# Patient Record
Sex: Male | Born: 1935 | Race: White | Hispanic: No | Marital: Married | Smoking: Former smoker
Health system: Southern US, Community
[De-identification: ages and names within clinical notes are randomized; demographics above are authoritative.]

## PROBLEM LIST (undated history)

## (undated) DIAGNOSIS — J449 Chronic obstructive pulmonary disease, unspecified: Secondary | ICD-10-CM

## (undated) DIAGNOSIS — I5020 Unspecified systolic (congestive) heart failure: Secondary | ICD-10-CM

## (undated) DIAGNOSIS — I4891 Unspecified atrial fibrillation: Secondary | ICD-10-CM

## (undated) DIAGNOSIS — N400 Enlarged prostate without lower urinary tract symptoms: Secondary | ICD-10-CM

---

## 2017-06-07 ENCOUNTER — Other Ambulatory Visit: Payer: Self-pay

## 2017-06-07 ENCOUNTER — Emergency Department: Payer: Medicare Other

## 2017-06-07 ENCOUNTER — Inpatient Hospital Stay
Admission: EM | Admit: 2017-06-07 | Discharge: 2017-06-11 | DRG: 682 | Disposition: A | Discharge status: Home Health | Payer: Medicare Other | Attending: Internal Medicine | Admitting: Internal Medicine

## 2017-06-07 DIAGNOSIS — I959 Hypotension, unspecified: Secondary | ICD-10-CM | POA: Diagnosis present

## 2017-06-07 DIAGNOSIS — L8990 Pressure ulcer of unspecified site, unspecified stage: Secondary | ICD-10-CM

## 2017-06-07 DIAGNOSIS — Z7951 Long term (current) use of inhaled steroids: Secondary | ICD-10-CM

## 2017-06-07 DIAGNOSIS — Z7189 Other specified counseling: Secondary | ICD-10-CM

## 2017-06-07 DIAGNOSIS — J441 Chronic obstructive pulmonary disease with (acute) exacerbation: Secondary | ICD-10-CM | POA: Diagnosis not present

## 2017-06-07 DIAGNOSIS — R531 Weakness: Secondary | ICD-10-CM

## 2017-06-07 DIAGNOSIS — R0902 Hypoxemia: Secondary | ICD-10-CM | POA: Diagnosis present

## 2017-06-07 DIAGNOSIS — N400 Enlarged prostate without lower urinary tract symptoms: Secondary | ICD-10-CM | POA: Diagnosis present

## 2017-06-07 DIAGNOSIS — Z87891 Personal history of nicotine dependence: Secondary | ICD-10-CM | POA: Diagnosis not present

## 2017-06-07 DIAGNOSIS — Z7901 Long term (current) use of anticoagulants: Secondary | ICD-10-CM

## 2017-06-07 DIAGNOSIS — Z66 Do not resuscitate: Secondary | ICD-10-CM | POA: Diagnosis not present

## 2017-06-07 DIAGNOSIS — G9341 Metabolic encephalopathy: Secondary | ICD-10-CM | POA: Diagnosis not present

## 2017-06-07 DIAGNOSIS — M109 Gout, unspecified: Secondary | ICD-10-CM | POA: Diagnosis not present

## 2017-06-07 DIAGNOSIS — Z79899 Other long term (current) drug therapy: Secondary | ICD-10-CM

## 2017-06-07 DIAGNOSIS — Z7982 Long term (current) use of aspirin: Secondary | ICD-10-CM | POA: Diagnosis not present

## 2017-06-07 DIAGNOSIS — R4182 Altered mental status, unspecified: Secondary | ICD-10-CM

## 2017-06-07 DIAGNOSIS — I5042 Chronic combined systolic (congestive) and diastolic (congestive) heart failure: Secondary | ICD-10-CM | POA: Diagnosis present

## 2017-06-07 DIAGNOSIS — I4891 Unspecified atrial fibrillation: Secondary | ICD-10-CM | POA: Diagnosis present

## 2017-06-07 DIAGNOSIS — N179 Acute kidney failure, unspecified: Secondary | ICD-10-CM | POA: Diagnosis not present

## 2017-06-07 DIAGNOSIS — Z515 Encounter for palliative care: Secondary | ICD-10-CM

## 2017-06-07 DIAGNOSIS — E860 Dehydration: Secondary | ICD-10-CM | POA: Diagnosis not present

## 2017-06-07 DIAGNOSIS — E785 Hyperlipidemia, unspecified: Secondary | ICD-10-CM | POA: Diagnosis present

## 2017-06-07 HISTORY — DX: Chronic obstructive pulmonary disease, unspecified: J44.9

## 2017-06-07 HISTORY — DX: Unspecified atrial fibrillation: I48.91

## 2017-06-07 HISTORY — DX: Unspecified systolic (congestive) heart failure: I50.20

## 2017-06-07 HISTORY — DX: Benign prostatic hyperplasia without lower urinary tract symptoms: N40.0

## 2017-06-07 LAB — CBC
HCT: 41.4 % (ref 40.0–52.0)
Hemoglobin: 13.3 g/dL (ref 13.0–18.0)
MCH: 28.5 pg (ref 26.0–34.0)
MCHC: 32.2 g/dL (ref 32.0–36.0)
MCV: 88.7 fL (ref 80.0–100.0)
PLATELETS: 252 10*3/uL (ref 150–440)
RBC: 4.67 MIL/uL (ref 4.40–5.90)
RDW: 14.8 % — AB (ref 11.5–14.5)
WBC: 12.5 10*3/uL — ABNORMAL HIGH (ref 3.8–10.6)

## 2017-06-07 LAB — COMPREHENSIVE METABOLIC PANEL
ALK PHOS: 55 U/L (ref 38–126)
ALT: 17 U/L (ref 17–63)
AST: 28 U/L (ref 15–41)
Albumin: 3.5 g/dL (ref 3.5–5.0)
Anion gap: 10 (ref 5–15)
BUN: 21 mg/dL — AB (ref 6–20)
CALCIUM: 9 mg/dL (ref 8.9–10.3)
CO2: 28 mmol/L (ref 22–32)
CREATININE: 1.34 mg/dL — AB (ref 0.61–1.24)
Chloride: 102 mmol/L (ref 101–111)
GFR calc Af Amer: 56 mL/min — ABNORMAL LOW (ref >60)
GFR, EST NON AFRICAN AMERICAN: 48 mL/min — AB (ref >60)
Glucose, Bld: 136 mg/dL — ABNORMAL HIGH (ref 65–99)
Potassium: 4.2 mmol/L (ref 3.5–5.1)
Sodium: 140 mmol/L (ref 135–145)
Total Bilirubin: 1 mg/dL (ref 0.3–1.2)
Total Protein: 7.3 g/dL (ref 6.5–8.1)

## 2017-06-07 LAB — ACETAMINOPHEN LEVEL: Acetaminophen (Tylenol), Serum: <10 ug/mL — ABNORMAL LOW (ref 10–30)

## 2017-06-07 LAB — ETHANOL: Alcohol, Ethyl (B): <10 mg/dL (ref <10)

## 2017-06-07 LAB — TROPONIN I: TROPONIN I: 0.03 ng/mL — AB (ref <0.03)

## 2017-06-07 LAB — SALICYLATE LEVEL: Salicylate Lvl: <7 mg/dL (ref 2.8–30.0)
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
## 2017-06-07 NOTE — ED Notes (Signed)
Spoke with wife - she states he left the house without his oxygen tank. Is always on 4 liters and his o2 sats drop just walking a few feet without it. Pt without oxygen for at least an hour.

## 2017-06-08 ENCOUNTER — Other Ambulatory Visit: Payer: Self-pay

## 2017-06-08 DIAGNOSIS — I5042 Chronic combined systolic (congestive) and diastolic (congestive) heart failure: Secondary | ICD-10-CM | POA: Diagnosis not present

## 2017-06-08 DIAGNOSIS — Z79899 Other long term (current) drug therapy: Secondary | ICD-10-CM | POA: Diagnosis not present

## 2017-06-08 DIAGNOSIS — Z66 Do not resuscitate: Secondary | ICD-10-CM | POA: Diagnosis not present

## 2017-06-08 DIAGNOSIS — N179 Acute kidney failure, unspecified: Secondary | ICD-10-CM | POA: Diagnosis present

## 2017-06-08 DIAGNOSIS — Z7901 Long term (current) use of anticoagulants: Secondary | ICD-10-CM | POA: Diagnosis not present

## 2017-06-08 DIAGNOSIS — E860 Dehydration: Secondary | ICD-10-CM | POA: Diagnosis not present

## 2017-06-08 DIAGNOSIS — Z87891 Personal history of nicotine dependence: Secondary | ICD-10-CM | POA: Diagnosis not present

## 2017-06-08 DIAGNOSIS — I959 Hypotension, unspecified: Secondary | ICD-10-CM | POA: Diagnosis not present

## 2017-06-08 DIAGNOSIS — E785 Hyperlipidemia, unspecified: Secondary | ICD-10-CM | POA: Diagnosis not present

## 2017-06-08 DIAGNOSIS — M109 Gout, unspecified: Secondary | ICD-10-CM | POA: Diagnosis not present

## 2017-06-08 DIAGNOSIS — I4891 Unspecified atrial fibrillation: Secondary | ICD-10-CM | POA: Diagnosis not present

## 2017-06-08 DIAGNOSIS — G9341 Metabolic encephalopathy: Secondary | ICD-10-CM | POA: Diagnosis not present

## 2017-06-08 DIAGNOSIS — J441 Chronic obstructive pulmonary disease with (acute) exacerbation: Secondary | ICD-10-CM | POA: Diagnosis not present

## 2017-06-08 DIAGNOSIS — N400 Enlarged prostate without lower urinary tract symptoms: Secondary | ICD-10-CM | POA: Diagnosis not present

## 2017-06-08 DIAGNOSIS — R0902 Hypoxemia: Secondary | ICD-10-CM | POA: Diagnosis not present

## 2017-06-08 DIAGNOSIS — Z7951 Long term (current) use of inhaled steroids: Secondary | ICD-10-CM | POA: Diagnosis not present

## 2017-06-08 DIAGNOSIS — Z7982 Long term (current) use of aspirin: Secondary | ICD-10-CM | POA: Diagnosis not present

## 2017-06-08 DIAGNOSIS — L8990 Pressure ulcer of unspecified site, unspecified stage: Secondary | ICD-10-CM

## 2017-06-08 DIAGNOSIS — R4182 Altered mental status, unspecified: Secondary | ICD-10-CM | POA: Diagnosis not present

## 2017-06-08 LAB — URINALYSIS, COMPLETE (UACMP) WITH MICROSCOPIC
Bilirubin Urine: NEGATIVE
Glucose, UA: NEGATIVE mg/dL
Hgb urine dipstick: NEGATIVE
Ketones, ur: 5 mg/dL — AB
Leukocytes, UA: NEGATIVE
Nitrite: NEGATIVE
Protein, ur: 30 mg/dL — AB
Specific Gravity, Urine: 1.025 (ref 1.005–1.030)
pH: 5 (ref 5.0–8.0)

## 2017-06-08 LAB — URINE DRUG SCREEN, QUALITATIVE (ARMC ONLY)
Amphetamines, Ur Screen: NOT DETECTED
Barbiturates, Ur Screen: NOT DETECTED
Benzodiazepine, Ur Scrn: POSITIVE — AB
Cannabinoid 50 Ng, Ur ~~LOC~~: NOT DETECTED
Cocaine Metabolite,Ur ~~LOC~~: NOT DETECTED
MDMA (Ecstasy)Ur Screen: POSITIVE — AB
Methadone Scn, Ur: NOT DETECTED
Opiate, Ur Screen: NOT DETECTED
Phencyclidine (PCP) Ur S: NOT DETECTED
Tricyclic, Ur Screen: NOT DETECTED

## 2017-06-08 LAB — MAGNESIUM: Magnesium: 1.7 mg/dL (ref 1.7–2.4)

## 2017-06-08 LAB — BLOOD GAS, ARTERIAL
Acid-Base Excess: 1.1 mmol/L (ref 0.0–2.0)
Bicarbonate: 25.1 mmol/L (ref 20.0–28.0)
FIO2: 0.36
O2 Saturation: 93.5 %
Patient temperature: 37
pCO2 arterial: 37 mmHg (ref 32.0–48.0)
pH, Arterial: 7.44 (ref 7.350–7.450)
pO2, Arterial: 66 mmHg — ABNORMAL LOW (ref 83.0–108.0)

## 2017-06-08 LAB — TSH: TSH: 1.894 u[IU]/mL (ref 0.350–4.500)

## 2017-06-08 LAB — PROTIME-INR
INR: 2.06
Prothrombin Time: 23 seconds — ABNORMAL HIGH (ref 11.4–15.2)

## 2017-06-08 LAB — DIGOXIN LEVEL: Digoxin Level: 0.9 ng/mL (ref 0.8–2.0)

## 2017-06-08 MED ORDER — ALLOPURINOL 100 MG PO TABS
100.0000 mg | ORAL_TABLET | Freq: Every day | ORAL | Status: DC
  Administered 2017-06-08 – 2017-06-11 (×4): 100 mg via ORAL
  Filled 2017-06-08 (×4): qty 1

## 2017-06-08 MED ORDER — WARFARIN SODIUM 3 MG PO TABS
3.0000 mg | ORAL_TABLET | Freq: Every day | ORAL | Status: DC
  Administered 2017-06-08 – 2017-06-10 (×3): 3 mg via ORAL
  Filled 2017-06-08 (×4): qty 1

## 2017-06-08 MED ORDER — MOMETASONE FURO-FORMOTEROL FUM 200-5 MCG/ACT IN AERO
2.0000 | INHALATION_SPRAY | Freq: Two times a day (BID) | RESPIRATORY_TRACT | Status: DC
  Administered 2017-06-08 – 2017-06-11 (×7): 2 via RESPIRATORY_TRACT
  Filled 2017-06-08: qty 8.8

## 2017-06-08 MED ORDER — GUAIFENESIN 200 MG PO TABS
200.0000 mg | ORAL_TABLET | ORAL | Status: DC | PRN
  Filled 2017-06-08: qty 1

## 2017-06-08 MED ORDER — ONDANSETRON HCL 4 MG/2ML IJ SOLN: 4.0000 mg | Freq: Four times a day (QID) | INTRAMUSCULAR | Status: DC | PRN

## 2017-06-08 MED ORDER — CLONAZEPAM 0.5 MG PO TABS
0.5000 mg | ORAL_TABLET | Freq: Two times a day (BID) | ORAL | Status: DC | PRN
  Administered 2017-06-08 – 2017-06-11 (×6): 0.5 mg via ORAL
  Filled 2017-06-08 (×6): qty 1

## 2017-06-08 MED ORDER — TORSEMIDE 20 MG PO TABS
10.0000 mg | ORAL_TABLET | Freq: Every day | ORAL | Status: DC
  Administered 2017-06-08 – 2017-06-09 (×2): 10 mg via ORAL
  Filled 2017-06-08 (×2): qty 1

## 2017-06-08 MED ORDER — ONDANSETRON HCL 4 MG PO TABS: 4.0000 mg | ORAL_TABLET | Freq: Four times a day (QID) | ORAL | Status: DC | PRN

## 2017-06-08 MED ORDER — VITAMIN D 1000 UNITS PO TABS
2000.0000 [IU] | ORAL_TABLET | Freq: Every day | ORAL | Status: DC
  Administered 2017-06-08 – 2017-06-11 (×4): 2000 [IU] via ORAL
  Filled 2017-06-08 (×4): qty 2

## 2017-06-08 MED ORDER — ADULT MULTIVITAMIN W/MINERALS CH
1.0000 | ORAL_TABLET | Freq: Every day | ORAL | Status: DC
  Administered 2017-06-08 – 2017-06-11 (×4): 1 via ORAL
  Filled 2017-06-08 (×4): qty 1

## 2017-06-08 MED ORDER — ATORVASTATIN CALCIUM 20 MG PO TABS
40.0000 mg | ORAL_TABLET | Freq: Every day | ORAL | Status: DC
  Administered 2017-06-08 – 2017-06-11 (×4): 40 mg via ORAL
  Filled 2017-06-08 (×4): qty 2

## 2017-06-08 MED ORDER — FENOFIBRATE 160 MG PO TABS
160.0000 mg | ORAL_TABLET | Freq: Every day | ORAL | Status: DC
  Administered 2017-06-08 – 2017-06-11 (×4): 160 mg via ORAL
  Filled 2017-06-08 (×4): qty 1

## 2017-06-08 MED ORDER — DOCUSATE SODIUM 100 MG PO CAPS
100.0000 mg | ORAL_CAPSULE | Freq: Two times a day (BID) | ORAL | Status: DC
  Administered 2017-06-08 – 2017-06-11 (×4): 100 mg via ORAL
  Filled 2017-06-08 (×7): qty 1

## 2017-06-08 MED ORDER — TIOTROPIUM BROMIDE MONOHYDRATE 18 MCG IN CAPS
18.0000 ug | ORAL_CAPSULE | Freq: Every day | RESPIRATORY_TRACT | Status: DC
  Administered 2017-06-08 – 2017-06-11 (×4): 18 ug via RESPIRATORY_TRACT
  Filled 2017-06-08 (×2): qty 5

## 2017-06-08 MED ORDER — ASPIRIN EC 81 MG PO TBEC
81.0000 mg | DELAYED_RELEASE_TABLET | Freq: Every day | ORAL | Status: DC
  Administered 2017-06-08 – 2017-06-11 (×4): 81 mg via ORAL
  Filled 2017-06-08 (×4): qty 1

## 2017-06-08 MED ORDER — SODIUM CHLORIDE 0.9 % IV SOLN
INTRAVENOUS | Status: DC
  Administered 2017-06-08: 04:00:00 via INTRAVENOUS

## 2017-06-08 MED ORDER — SACUBITRIL-VALSARTAN 24-26 MG PO TABS
1.0000 | ORAL_TABLET | Freq: Two times a day (BID) | ORAL | Status: DC
  Administered 2017-06-08 – 2017-06-11 (×6): 1 via ORAL
  Filled 2017-06-08 (×9): qty 1

## 2017-06-08 MED ORDER — DIGOXIN 125 MCG PO TABS
0.1250 mg | ORAL_TABLET | Freq: Every day | ORAL | Status: DC
  Administered 2017-06-08 – 2017-06-11 (×4): 0.125 mg via ORAL
  Filled 2017-06-08 (×4): qty 1

## 2017-06-08 MED ORDER — WARFARIN - PHYSICIAN DOSING INPATIENT: Freq: Every day | Status: DC

## 2017-06-08 MED ORDER — CARVEDILOL 3.125 MG PO TABS
6.2500 mg | ORAL_TABLET | Freq: Two times a day (BID) | ORAL | Status: DC
  Administered 2017-06-08 – 2017-06-09 (×3): 6.25 mg via ORAL
  Filled 2017-06-08 (×3): qty 2

## 2017-06-08 MED ORDER — ACETAMINOPHEN 325 MG PO TABS: 650.0000 mg | ORAL_TABLET | Freq: Four times a day (QID) | ORAL | Status: DC | PRN

## 2017-06-08 MED ORDER — ACETAMINOPHEN 650 MG RE SUPP: 650.0000 mg | Freq: Four times a day (QID) | RECTAL | Status: DC | PRN

## 2017-06-08 MED ORDER — FINASTERIDE 5 MG PO TABS
5.0000 mg | ORAL_TABLET | Freq: Every day | ORAL | Status: DC
  Administered 2017-06-08 – 2017-06-11 (×4): 5 mg via ORAL
  Filled 2017-06-08 (×4): qty 1

## 2017-06-08 MED ORDER — TAMSULOSIN HCL 0.4 MG PO CAPS
0.4000 mg | ORAL_CAPSULE | Freq: Every day | ORAL | Status: DC
  Administered 2017-06-08 – 2017-06-11 (×4): 0.4 mg via ORAL
  Filled 2017-06-08 (×4): qty 1
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
## 2017-06-08 NOTE — Plan of Care (Signed)
  Problem: Education: Goal: Knowledge of General Education information will improve Outcome: Progressing   Problem: Health Behavior/Discharge Planning: Goal: Ability to manage health-related needs will improve Outcome: Progressing   Problem: Clinical Measurements: Goal: Ability to maintain clinical measurements within normal limits will improve Outcome: Progressing Goal: Cardiovascular complication will be avoided Outcome: Progressing   Problem: Elimination: Goal: Will not experience complications related to bowel motility Outcome: Progressing Goal: Will not experience complications related to urinary retention Outcome: Progressing   Problem: Pain Managment: Goal: General experience of comfort will improve Outcome: Progressing   Problem: Safety: Goal: Ability to remain free from injury will improve Outcome: Progressing   Problem: Skin Integrity: Goal: Risk for impaired skin integrity will decrease Outcome: Progressing

## 2017-06-08 NOTE — Progress Notes (Signed)
Pt. Declined BIPAP at this time. Pt. Resting comfortably in no apparent distress. SpO2 94% on O2 via 4LPM Nasal Cannula. Will continue to assess.

## 2017-06-08 NOTE — Progress Notes (Signed)
ANTICOAGULATION CONSULT NOTE - Initial Consult  Pharmacy Consult for warfarin Indication: atrial fibrillation  No Known Allergies  Patient Measurements: Height: 6' (182.9 cm) Weight: 175 lb (79.4 kg) IBW/kg (Calculated) : 77.6  Vital Signs: Temp: 98.1 F (36.7 C) (04/22 1501) Temp Source: Oral (04/22 1501) BP: 102/82 (04/22 1501) Pulse Rate: 80 (04/22 1501)  Labs: Recent Labs    06/07/17 2141 06/08/17 0411  HGB 13.3  --   HCT 41.4  --   PLT 252  --   LABPROT  --  23.0*  INR  --  2.06  CREATININE 1.34*  --   TROPONINI 0.03*  --     Estimated Creatinine Clearance: 48.3 mL/min (A) (by C-G formula based on SCr of 1.34 mg/dL (H)).   Medical History: Past Medical History:  Diagnosis Date  . A-fib ([facility])   . BPH (benign prostatic hyperplasia)   . COPD (chronic obstructive pulmonary disease) ([facility])    4L O2 via [place] 24/7  . Systolic CHF ([facility])     Medications:  Medications Prior to Admission  Medication Sig Dispense Refill Last Dose  . allopurinol (ZYLOPRIM) 100 MG tablet Take 100 mg by mouth daily.   unknown at Unknown time  . aspirin EC 81 MG tablet Take 81 mg by mouth daily.   unknown at Unknown time  . atorvastatin (LIPITOR) 40 MG tablet Take 40 mg by mouth daily.   unknown at Unknown time  . budesonide-formoterol (SYMBICORT) 160-4.5 MCG/ACT inhaler Inhale 2 puffs into the lungs 2 (two) times daily.   unknown at Unknown time  . carvedilol (COREG) 6.25 MG tablet Take 6.25 mg by mouth 2 (two) times daily with a meal.   unknown at Unknown time  . cholecalciferol (VITAMIN D) 1000 units tablet Take 2,000 Units by mouth daily.   unknown at Unknown time  . clonazePAM (KLONOPIN) 0.5 MG tablet Take 0.5 mg by mouth 2 (two) times daily as needed for anxiety.   unknown  . digoxin (LANOXIN) 0.125 MG tablet Take 0.125 mg by mouth daily.   unknown  . fenofibrate 160 MG tablet Take 160 mg by mouth daily.   unknown  . finasteride (PROSCAR) 5 MG tablet Take 5 mg by mouth daily.    unknown  . Multiple Vitamin (MULTIVITAMIN WITH MINERALS) TABS tablet Take 1 tablet by mouth daily.   unknown  . sacubitril-valsartan (ENTRESTO) 24-26 MG Take 1 tablet by mouth 2 (two) times daily.   unknown  . tamsulosin (FLOMAX) 0.4 MG CAPS capsule Take 0.4 mg by mouth daily.   unknown  . tiotropium (SPIRIVA) 18 MCG inhalation capsule Place 18 mcg into inhaler and inhale daily.   unknown  . torsemide (DEMADEX) 10 MG tablet Take 10 mg by mouth daily.   unknown  . traMADol (ULTRAM) 50 MG tablet Take 50 mg by mouth daily as needed for moderate pain.   unknown  . traZODone (DESYREL) 50 MG tablet Take 50 mg by mouth at bedtime as needed for sleep.   unknown  . warfarin (COUMADIN) 3 MG tablet Take 3 mg by mouth daily.   unknown at Unknown time   Scheduled:  . allopurinol  100 mg Oral Daily  . aspirin EC  81 mg Oral Daily  . atorvastatin  40 mg Oral Daily  . carvedilol  6.25 mg Oral BID WC  . cholecalciferol  2,000 Units Oral Daily  . digoxin  0.125 mg Oral Daily  . docusate sodium  100 mg Oral BID  . fenofibrate  160 mg Oral Daily  . finasteride  5 mg Oral Daily  . mometasone-formoterol  2 puff Inhalation BID  . multivitamin with minerals  1 tablet Oral Daily  . sacubitril-valsartan  1 tablet Oral BID  . tamsulosin  0.4 mg Oral Daily  . tiotropium  18 mcg Inhalation Daily  . torsemide  10 mg Oral Daily  . warfarin  3 mg Oral Daily   Infusions:   PRN: acetaminophen **OR** acetaminophen, clonazePAM, ondansetron **OR** ondansetron (ZOFRAN) IV Anti-infectives (From admission, onward)   None      Assessment: 83 year old male with afib comes in on warfarin 3mg  daily with inr 2.06. INR goal 2-3  Goal of Therapy:  INR 2-3 Monitor platelets by anticoagulation protocol: Yes   Plan:  Continue home dose of warfarin 3mg  po daily, will monitor daily for now. Admit INR 2.06, pharmacy will monitor   Matthew Simmons 06/08/2017,3:17 PM

## 2017-06-08 NOTE — ED Notes (Signed)
ED Provider at bedside. 

## 2017-06-08 NOTE — Progress Notes (Signed)
Per telemetry, Pt had 5 beat run of V-tach. MD Sudini paged and made aware.

## 2017-06-08 NOTE — Progress Notes (Signed)
Advance care planning  Purpose of Encounter Discussed regarding acute hospital admission with COPD exacerbation and CODE STATUS discussion  Parties in Attendance Patient and wife  Patients Decisional capacity Patient is alert and oriented.  Able to make his own decisions.   Goals of care determination For admissions this year with COPD.  Poor long-term prognosis.  We discussed regarding CODE STATUS.  Patient wants to be DO NOT RESUSCITATE and DO NOT INTUBATE.  Once his wife to be healthcare power of attorney.  Orders entered for DNR  Patient wants to follow-up with his lung physician in [place] will, be on appropriate medication and he is hoping he will improve from his acute illness.   Time spent-20 minutes

## 2017-06-08 NOTE — ED Notes (Addendum)
Pt given water per request with MD approval. Wife at bedside. Monitor turned back on, pt remains on 4L O2 at 98%

## 2017-06-09 DIAGNOSIS — R4182 Altered mental status, unspecified: Secondary | ICD-10-CM | POA: Diagnosis not present

## 2017-06-09 DIAGNOSIS — N179 Acute kidney failure, unspecified: Secondary | ICD-10-CM | POA: Diagnosis not present

## 2017-06-09 LAB — PROTIME-INR
INR: 2.01
PROTHROMBIN TIME: 22.6 s — AB (ref 11.4–15.2)

## 2017-06-09 MED ORDER — GUAIFENESIN 100 MG/5ML PO SOLN
10.0000 mL | ORAL | Status: DC | PRN
  Filled 2017-06-09: qty 10

## 2017-06-09 MED ORDER — GUAIFENESIN ER 600 MG PO TB12
600.0000 mg | ORAL_TABLET | Freq: Two times a day (BID) | ORAL | Status: DC
Start: 2017-06-09 — End: 2017-06-11
  Administered 2017-06-09 – 2017-06-11 (×5): 600 mg via ORAL
  Filled 2017-06-09 (×5): qty 1

## 2017-06-09 MED ORDER — PREDNISONE 50 MG PO TABS
50.0000 mg | ORAL_TABLET | Freq: Every day | ORAL | Status: DC
  Administered 2017-06-09 – 2017-06-11 (×3): 50 mg via ORAL
  Filled 2017-06-09 (×3): qty 1

## 2017-06-09 MED ORDER — IPRATROPIUM-ALBUTEROL 0.5-2.5 (3) MG/3ML IN SOLN
3.0000 mL | Freq: Four times a day (QID) | RESPIRATORY_TRACT | Status: DC
  Administered 2017-06-09 – 2017-06-11 (×9): 3 mL via RESPIRATORY_TRACT
  Filled 2017-06-09 (×9): qty 3

## 2017-06-09 NOTE — Progress Notes (Signed)
Sent text page for patient's request for mucinex. Would like medication during morning med pass. Awaiting order

## 2017-06-09 NOTE — Progress Notes (Signed)
[facility] - [place] [facility]   PATIENT NAME: Matthew Simmons    MR#:  [id number]  DATE OF BIRTH:  [birth year]  SUBJECTIVE:  CHIEF COMPLAINT:   Chief Complaint  Patient presents with  . Shortness of Breath   Continues to have shortness of breath.  REVIEW OF SYSTEMS:    Review of Systems  Constitutional: Positive for malaise/fatigue. Negative for chills and fever.  HENT: Negative for sore throat.   Eyes: Negative for blurred vision, double vision and pain.  Respiratory: Positive for cough, shortness of breath and wheezing. Negative for hemoptysis and sputum production.   Cardiovascular: Negative for chest pain, palpitations, orthopnea and leg swelling.  Gastrointestinal: Negative for abdominal pain, constipation, diarrhea, heartburn, nausea and vomiting.  Genitourinary: Negative for dysuria and hematuria.  Musculoskeletal: Negative for back pain and joint pain.  Skin: Negative for rash.  Neurological: Negative for sensory change, speech change, focal weakness and headaches.  Endo/Heme/Allergies: Does not bruise/bleed easily.  Psychiatric/Behavioral: Negative for depression. The patient is not nervous/anxious.     DRUG ALLERGIES:  No Known Allergies  VITALS:  Blood pressure 104/71, pulse 98, temperature 98.2 F (36.8 C), temperature source Oral, resp. rate 17, height 6' (1.829 m), weight 81.3 kg (179 lb 3.2 oz), SpO2 94 %.  PHYSICAL EXAMINATION:   Physical Exam  GENERAL:  83 y.o.-year-old patient lying in the bed with conversational dyspnea EYES: Pupils equal, round, reactive to light and accommodation. No scleral icterus. Extraocular muscles intact.  HEENT: Head atraumatic, normocephalic. Oropharynx and nasopharynx clear.  NECK:  Supple, no jugular venous distention. No thyroid enlargement, no tenderness.  LUNGS: Bilateral wheezing and decreased air entry CARDIOVASCULAR: S1, S2 normal. No murmurs, rubs, or gallops.  ABDOMEN: Soft, nontender,  nondistended. Bowel sounds present. No organomegaly or mass.  EXTREMITIES: No cyanosis, clubbing or edema b/l.    NEUROLOGIC: Cranial nerves II through XII are intact. No focal Motor or sensory deficits b/l.   PSYCHIATRIC: The patient is alert and oriented x 3.  SKIN: No obvious rash, lesion, or ulcer.   LABORATORY PANEL:   CBC Recent Labs  Lab 06/07/17 2141  WBC 12.5*  HGB 13.3  HCT 41.4  PLT 252   ------------------------------------------------------------------------------------------------------------------ Chemistries  Recent Labs  Lab 06/07/17 2141 06/08/17 0411  NA 140  --   K 4.2  --   CL 102  --   CO2 28  --   GLUCOSE 136*  --   BUN 21*  --   CREATININE 1.34*  --   CALCIUM 9.0  --   MG  --  1.7  AST 28  --   ALT 17  --   ALKPHOS 55  --   BILITOT 1.0  --    ------------------------------------------------------------------------------------------------------------------  Cardiac Enzymes Recent Labs  Lab 06/07/17 2141  TROPONINI 0.03*   ------------------------------------------------------------------------------------------------------------------  RADIOLOGY:  Ct Head Wo Contrast  Result Date: 06/07/2017 CLINICAL DATA:  Altered level of consciousness EXAM: CT HEAD WITHOUT CONTRAST TECHNIQUE: Contiguous axial images were obtained from the base of the skull through the vertex without intravenous contrast. COMPARISON:  None. FINDINGS: Brain: Mild age related volume loss. No acute intracranial abnormality. Specifically, no hemorrhage, hydrocephalus, mass lesion, acute infarction, or significant intracranial injury. Vascular: No hyperdense vessel or unexpected calcification. Skull: No acute calvarial abnormality. Sinuses/Orbits: Mucosal thickening in the ethmoid air cells. Other: None IMPRESSION: No acute intracranial abnormality. Electronically Signed   By: Charlett NoseKevin  Dover M.D.   On: 06/07/2017 22:12   Dg Chest Portable  1 View  Result Date: 06/07/2017 CLINICAL  DATA:  Shortness of Breath EXAM: PORTABLE CHEST 1 VIEW COMPARISON:  None. FINDINGS: Cardiac shadow is enlarged. Aortic calcifications are seen. The lungs are well aerated bilaterally without focal infiltrate or sizable effusion. Mild scarring is noted in the right base. No bony abnormality is noted. IMPRESSION: No acute abnormality seen. Electronically Signed   By: Alcide Clever M.D.   On: 06/07/2017 22:23     ASSESSMENT AND PLAN:   This is an 83 year old male admitted for acute kidney injury.  *Acute COPD exacerbation. Prednisone, Nebs. Inhalers Patient wants to follow-up with pulmonary as outpatient  *Acute metabolic encephalopathy has resolved.  *Chronic systolic CHF is stable.  Continue home medications.  *  Atrial fibrillation: Rate controlled; continue warfarin.  *  BPH: Continue Flomax as well as finasteride  All the records are reviewed and case discussed with Care Management/Social Worker Management plans discussed with the patient, family and they are in agreement.  CODE STATUS: DNR  DVT Prophylaxis: SCDs  TOTAL TIME TAKING CARE OF THIS PATIENT: 30 minutes.   POSSIBLE D/C IN 1-2 DAYS, DEPENDING ON CLINICAL CONDITION.  Molinda Bailiff Sheronica Corey M.D on 06/09/2017 at 1:08 PM  Between 7am to 6pm - Pager - [phone]  After 6pm go to [email or web address] - password [email or web address]  [facility]  Office  [phone]  CC: Primary care physician; System, Pcp Not In  Note: This dictation was prepared with Dragon dictation along with smaller phrase technology. Any transcriptional errors that result from this process are unintentional.

## 2017-06-09 NOTE — Progress Notes (Signed)
Contacted by telephone by Investigator Sharon MtKitts, [facility].Asked if pt was a patient here, since on directory informed him he was.  He stated patient had been listed as Education officer, communityndangered/Missing Person.Asked when admitted, what law enforcement agency was investigating.Explained could not  release that information.

## 2017-06-09 NOTE — Plan of Care (Signed)
  Problem: Education: Goal: Knowledge of General Education information will improve Outcome: Progressing   Problem: Health Behavior/Discharge Planning: Goal: Ability to manage health-related needs will improve Outcome: Progressing   Problem: Clinical Measurements: Goal: Ability to maintain clinical measurements within normal limits will improve Outcome: Progressing   Problem: Coping: Goal: Level of anxiety will decrease Outcome: Progressing   Problem: Elimination: Goal: Will not experience complications related to bowel motility Outcome: Progressing Goal: Will not experience complications related to urinary retention Outcome: Progressing   Problem: Pain Managment: Goal: General experience of comfort will improve Outcome: Progressing   Problem: Safety: Goal: Ability to remain free from injury will improve Outcome: Progressing

## 2017-06-09 NOTE — Progress Notes (Signed)
Notified MD of telemetry's call of 8 beat run of V tach. VS stable and patient being monitored.

## 2017-06-09 NOTE — Progress Notes (Signed)
ANTICOAGULATION CONSULT NOTE - Initial Consult  Pharmacy Consult for warfarin Indication: atrial fibrillation  No Known Allergies  Patient Measurements: Height: 6' (182.9 cm) Weight: 179 lb 3.2 oz (81.3 kg) IBW/kg (Calculated) : 77.6  Vital Signs: Temp: 98.2 F (36.8 C) (04/23 0354) Temp Source: Oral (04/23 0354) BP: 104/71 (04/23 0354) Pulse Rate: 98 (04/23 0354)  Labs: Recent Labs    06/07/17 2141 06/08/17 0411 06/09/17 0432  HGB 13.3  --   --   HCT 41.4  --   --   PLT 252  --   --   LABPROT  --  23.0* 22.6*  INR  --  2.06 2.01  CREATININE 1.34*  --   --   TROPONINI 0.03*  --   --     Estimated Creatinine Clearance: 48.3 mL/min (A) (by C-G formula based on SCr of 1.34 mg/dL (H)).   Medical History: Past Medical History:  Diagnosis Date  . A-fib (HCC)   . BPH (benign prostatic hyperplasia)   . COPD (chronic obstructive pulmonary disease) (HCC)    4L O2 via [place] 24/7  . Systolic CHF (HCC)     Medications:  Medications Prior to Admission  Medication Sig Dispense Refill Last Dose  . allopurinol (ZYLOPRIM) 100 MG tablet Take 100 mg by mouth daily.   unknown at Unknown time  . aspirin EC 81 MG tablet Take 81 mg by mouth daily.   unknown at Unknown time  . atorvastatin (LIPITOR) 40 MG tablet Take 40 mg by mouth daily.   unknown at Unknown time  . budesonide-formoterol (SYMBICORT) 160-4.5 MCG/ACT inhaler Inhale 2 puffs into the lungs 2 (two) times daily.   unknown at Unknown time  . carvedilol (COREG) 6.25 MG tablet Take 6.25 mg by mouth 2 (two) times daily with a meal.   unknown at Unknown time  . cholecalciferol (VITAMIN D) 1000 units tablet Take 2,000 Units by mouth daily.   unknown at Unknown time  . clonazePAM (KLONOPIN) 0.5 MG tablet Take 0.5 mg by mouth 2 (two) times daily as needed for anxiety.   unknown  . digoxin (LANOXIN) 0.125 MG tablet Take 0.125 mg by mouth daily.   unknown  . fenofibrate 160 MG tablet Take 160 mg by mouth daily.   unknown  .  finasteride (PROSCAR) 5 MG tablet Take 5 mg by mouth daily.   unknown  . Multiple Vitamin (MULTIVITAMIN WITH MINERALS) TABS tablet Take 1 tablet by mouth daily.   unknown  . sacubitril-valsartan (ENTRESTO) 24-26 MG Take 1 tablet by mouth 2 (two) times daily.   unknown  . tamsulosin (FLOMAX) 0.4 MG CAPS capsule Take 0.4 mg by mouth daily.   unknown  . tiotropium (SPIRIVA) 18 MCG inhalation capsule Place 18 mcg into inhaler and inhale daily.   unknown  . torsemide (DEMADEX) 10 MG tablet Take 10 mg by mouth daily.   unknown  . traMADol (ULTRAM) 50 MG tablet Take 50 mg by mouth daily as needed for moderate pain.   unknown  . traZODone (DESYREL) 50 MG tablet Take 50 mg by mouth at bedtime as needed for sleep.   unknown  . warfarin (COUMADIN) 3 MG tablet Take 3 mg by mouth daily.   unknown at Unknown time   Scheduled:  . allopurinol  100 mg Oral Daily  . aspirin EC  81 mg Oral Daily  . atorvastatin  40 mg Oral Daily  . carvedilol  6.25 mg Oral BID WC  . cholecalciferol  2,000 Units Oral  Daily  . digoxin  0.125 mg Oral Daily  . docusate sodium  100 mg Oral BID  . fenofibrate  160 mg Oral Daily  . finasteride  5 mg Oral Daily  . mometasone-formoterol  2 puff Inhalation BID  . multivitamin with minerals  1 tablet Oral Daily  . sacubitril-valsartan  1 tablet Oral BID  . tamsulosin  0.4 mg Oral Daily  . tiotropium  18 mcg Inhalation Daily  . torsemide  10 mg Oral Daily  . warfarin  3 mg Oral Daily   Infusions:   PRN: acetaminophen **OR** acetaminophen, clonazePAM, guaiFENesin, ondansetron **OR** ondansetron (ZOFRAN) IV Anti-infectives (From admission, onward)   None      Assessment: 83 year old male with afib comes in on warfarin 3mg  daily with inr 2.06. INR goal 2-3   4/22 INR 2.06 4/23  INR 2.01  Goal of Therapy:  INR 2-3 Monitor platelets by anticoagulation protocol: Yes    Plan:  Continue home dose of warfarin 3mg  po daily, will monitor daily for now.  Olene Floss,  Pharm.D, BCPS Clinical Pharmacist  06/09/2017,8:33 AM

## 2017-06-10 DIAGNOSIS — N179 Acute kidney failure, unspecified: Secondary | ICD-10-CM | POA: Diagnosis not present

## 2017-06-10 DIAGNOSIS — R4182 Altered mental status, unspecified: Secondary | ICD-10-CM | POA: Diagnosis not present

## 2017-06-10 LAB — BASIC METABOLIC PANEL
ANION GAP: 5 (ref 5–15)
BUN: 28 mg/dL — ABNORMAL HIGH (ref 6–20)
CHLORIDE: 101 mmol/L (ref 101–111)
CO2: 33 mmol/L — AB (ref 22–32)
Calcium: 8.7 mg/dL — ABNORMAL LOW (ref 8.9–10.3)
Creatinine, Ser: 1.41 mg/dL — ABNORMAL HIGH (ref 0.61–1.24)
GFR calc non Af Amer: 45 mL/min — ABNORMAL LOW (ref >60)
GFR, EST AFRICAN AMERICAN: 53 mL/min — AB (ref >60)
Glucose, Bld: 119 mg/dL — ABNORMAL HIGH (ref 65–99)
POTASSIUM: 4.2 mmol/L (ref 3.5–5.1)
SODIUM: 139 mmol/L (ref 135–145)

## 2017-06-10 LAB — PROTIME-INR
INR: 2.18
Prothrombin Time: 24.1 seconds — ABNORMAL HIGH (ref 11.4–15.2)

## 2017-06-10 MED ORDER — SODIUM CHLORIDE 0.9 % IV BOLUS
500.0000 mL | Freq: Once | INTRAVENOUS | Status: AC
  Administered 2017-06-10: 500 mL via INTRAVENOUS

## 2017-06-10 MED ORDER — SODIUM CHLORIDE 0.9 % IV SOLN
INTRAVENOUS | Status: DC
  Administered 2017-06-10 (×2): via INTRAVENOUS

## 2017-06-10 NOTE — Progress Notes (Signed)
ANTICOAGULATION CONSULT NOTE - Initial Consult  Pharmacy Consult for warfarin Indication: atrial fibrillation  No Known Allergies  Patient Measurements: Height: 6' (182.9 cm) Weight: 195 lb 6.4 oz (88.6 kg) IBW/kg (Calculated) : 77.6  Vital Signs: Temp: 97.5 F (36.4 C) (04/24 0408) Temp Source: Oral (04/24 0408) BP: 91/66 (04/24 0550) Pulse Rate: 76 (04/24 0550)  Labs: Recent Labs    06/07/17 2141 06/08/17 0411 06/09/17 0432 06/10/17 0620  HGB 13.3  --   --   --   HCT 41.4  --   --   --   PLT 252  --   --   --   LABPROT  --  23.0* 22.6* 24.1*  INR  --  2.06 2.01 2.18  CREATININE 1.34*  --   --   --   TROPONINI 0.03*  --   --   --     Estimated Creatinine Clearance: 48.3 mL/min (A) (by C-G formula based on SCr of 1.34 mg/dL (H)).   Medical History: Past Medical History:  Diagnosis Date  . A-fib (HCC)   . BPH (benign prostatic hyperplasia)   . COPD (chronic obstructive pulmonary disease) (HCC)    4L O2 via [place] 24/7  . Systolic CHF (HCC)     Medications:  Medications Prior to Admission  Medication Sig Dispense Refill Last Dose  . allopurinol (ZYLOPRIM) 100 MG tablet Take 100 mg by mouth daily.   unknown at Unknown time  . aspirin EC 81 MG tablet Take 81 mg by mouth daily.   unknown at Unknown time  . atorvastatin (LIPITOR) 40 MG tablet Take 40 mg by mouth daily.   unknown at Unknown time  . budesonide-formoterol (SYMBICORT) 160-4.5 MCG/ACT inhaler Inhale 2 puffs into the lungs 2 (two) times daily.   unknown at Unknown time  . carvedilol (COREG) 6.25 MG tablet Take 6.25 mg by mouth 2 (two) times daily with a meal.   unknown at Unknown time  . cholecalciferol (VITAMIN D) 1000 units tablet Take 2,000 Units by mouth daily.   unknown at Unknown time  . clonazePAM (KLONOPIN) 0.5 MG tablet Take 0.5 mg by mouth 2 (two) times daily as needed for anxiety.   unknown  . digoxin (LANOXIN) 0.125 MG tablet Take 0.125 mg by mouth daily.   unknown  . fenofibrate 160 MG tablet  Take 160 mg by mouth daily.   unknown  . finasteride (PROSCAR) 5 MG tablet Take 5 mg by mouth daily.   unknown  . Multiple Vitamin (MULTIVITAMIN WITH MINERALS) TABS tablet Take 1 tablet by mouth daily.   unknown  . sacubitril-valsartan (ENTRESTO) 24-26 MG Take 1 tablet by mouth 2 (two) times daily.   unknown  . tamsulosin (FLOMAX) 0.4 MG CAPS capsule Take 0.4 mg by mouth daily.   unknown  . tiotropium (SPIRIVA) 18 MCG inhalation capsule Place 18 mcg into inhaler and inhale daily.   unknown  . torsemide (DEMADEX) 10 MG tablet Take 10 mg by mouth daily.   unknown  . traMADol (ULTRAM) 50 MG tablet Take 50 mg by mouth daily as needed for moderate pain.   unknown  . traZODone (DESYREL) 50 MG tablet Take 50 mg by mouth at bedtime as needed for sleep.   unknown  . warfarin (COUMADIN) 3 MG tablet Take 3 mg by mouth daily.   unknown at Unknown time   Scheduled:  . allopurinol  100 mg Oral Daily  . aspirin EC  81 mg Oral Daily  . atorvastatin  40  mg Oral Daily  . carvedilol  6.25 mg Oral BID WC  . cholecalciferol  2,000 Units Oral Daily  . digoxin  0.125 mg Oral Daily  . docusate sodium  100 mg Oral BID  . fenofibrate  160 mg Oral Daily  . finasteride  5 mg Oral Daily  . guaiFENesin  600 mg Oral BID  . ipratropium-albuterol  3 mL Nebulization Q6H  . mometasone-formoterol  2 puff Inhalation BID  . multivitamin with minerals  1 tablet Oral Daily  . predniSONE  50 mg Oral Q breakfast  . sacubitril-valsartan  1 tablet Oral BID  . tamsulosin  0.4 mg Oral Daily  . tiotropium  18 mcg Inhalation Daily  . torsemide  10 mg Oral Daily  . warfarin  3 mg Oral Daily   Infusions:   PRN: acetaminophen **OR** acetaminophen, clonazePAM, guaiFENesin, ondansetron **OR** ondansetron (ZOFRAN) IV Anti-infectives (From admission, onward)   None      Assessment: 83 year old male with afib comes in on warfarin 3mg  daily with inr 2.06. INR goal 2-3   4/22 INR 2.06 4/23  INR 2.01 4/24  INR 2.18  Goal of  Therapy:  INR 2-3 Monitor platelets by anticoagulation protocol: Yes    Plan:  Continue home dose of warfarin 3mg  po daily. Will recheck INR in 2 days  Olene Floss, Pharm.D, BCPS Clinical Pharmacist 06/10/2017,7:42 AM

## 2017-06-10 NOTE — Evaluation (Signed)
Physical Therapy Evaluation Patient Details Name: Matthew Simmons MRN: [id number] DOB: October 04, 1934 Today's Date: 06/10/2017   History of Present Illness  Patient is an 83 year old male admitted from home with AMS.  PMH includes COPD, systolic CHF, atrial fibrillation and BPH.  Clinical Impression  Patient is an 83 year old male who lives in a one story home with his wife.  He is independent at baseline without use of RW but has been hospitalized 4x this year and has seen a recent decline in function.  Pt is in bed upon PT arrival and able to perform bed mobility independently.  He presents with kyphotic posture, generalized weakness of UE, and WNL strength of LE.  Pt able to stand with supervision for safety and use of RW.  He ambulated 30 ft in room with RW and education concerning safe use and sequencing.  He was able to sit in the chair with controlled descent using bilateral UE and VC's from PT for safety.  PT provided education concerning benefit of PT vs pulmonary rehab and the nature of COPD related to posture.  Pt's O2 desat to 88% during ambulation and returned to low 90% range following 2 min of rest and breathing exercises.  Pt will continue to benefit from skilled PT with focus on strength, tolerance to activity, safe use of RW, balance and safe functional mobility.  He will also benefit from pulmonary rehab but pt lives in [place] and will look into programs closer to home.    Follow Up Recommendations Home health PT    Equipment Recommendations  Rolling walker with 5" wheels    Recommendations for Other Services       Precautions / Restrictions Precautions Precautions: Fall Restrictions Weight Bearing Restrictions: No      Mobility  Bed Mobility Overal bed mobility: Independent                Transfers Overall transfer level: Needs assistance Equipment used: Rolling walker (2 wheeled) Transfers: Sit to/from Stand Sit to Stand: Supervision         General  transfer comment: Pt able to physically initiate STS but requires supervision due to being unsteady on feet and unfamiliar with use of RW.  PT educated pt concerning proper use of RW and fall risk.  Ambulation/Gait Ambulation/Gait assistance: Supervision Ambulation Distance (Feet): 30 Feet Assistive device: Rolling walker (2 wheeled)       General Gait Details: Low to moderate foot clearance, kyphotic posture, VC's for safe management of RW and sequencing during turns.    Stairs            Wheelchair Mobility    Modified Rankin (Stroke Patients Only)       Balance Overall balance assessment: Needs assistance Sitting-balance support: No upper extremity supported;Feet supported       Standing balance support: Bilateral upper extremity supported                 High level balance activites: Side stepping;Backward walking;Direction changes;Turns High Level Balance Comments: Pt able to perform all higher level balance activity with slowed gait and use of RW.             Pertinent Vitals/Pain Pain Assessment: No/denies pain    Home Living Family/patient expects to be discharged to:: Private residence Living Arrangements: Spouse/significant other Available Help at Discharge: Available 24 hours/day Type of Home: House Home Access: Stairs to enter Entrance Stairs-Rails: Can reach both Entrance Stairs-Number of Steps: 4 Home Layout:  One level Home Equipment: None      Prior Function Level of Independence: Independent         Comments: Pt able to drive and community ambulate prior to admission.     Hand Dominance        Extremity/Trunk Assessment   Upper Extremity Assessment Upper Extremity Assessment: Generalized weakness    Lower Extremity Assessment Lower Extremity Assessment: Overall WFL for tasks assessed    Cervical / Trunk Assessment Cervical / Trunk Assessment: Kyphotic(Presents with kyphotic posture and short labored breaths when  not sitting upright.)  Communication   Communication: No difficulties  Cognition Arousal/Alertness: Awake/alert Behavior During Therapy: WFL for tasks assessed/performed Overall Cognitive Status: Within Functional Limits for tasks assessed                                        General Comments      Exercises Other Exercises Other Exercises: Education regarding benefit of home health PT and pulmonary rehab. Other Exercises: Education concerning posture related to breathing and the nature of COPD.   Assessment/Plan    PT Assessment Patient needs continued PT services  PT Problem List Decreased strength;Decreased mobility;Decreased activity tolerance;Decreased balance;Decreased knowledge of use of DME;Cardiopulmonary status limiting activity       PT Treatment Interventions DME instruction;Therapeutic activities;Gait training;Therapeutic exercise;Patient/family education;Stair training;Balance training;Functional mobility training;Neuromuscular re-education    PT Goals (Current goals can be found in the Care Plan section)  Acute Rehab PT Goals Patient Stated Goal: To return home and continue to be able to walk as much as possible. PT Goal Formulation: With patient/family Time For Goal Achievement: 06/24/17 Potential to Achieve Goals: Good    Frequency Min 2X/week   Barriers to discharge        Co-evaluation               AM-PAC PT "6 Clicks" Daily Activity  Outcome Measure Difficulty turning over in bed (including adjusting bedclothes, sheets and blankets)?: A Little Difficulty moving from lying on back to sitting on the side of the bed? : A Little Difficulty sitting down on and standing up from a chair with arms (e.g., wheelchair, bedside commode, etc,.)?: A Little Help needed moving to and from a bed to chair (including a wheelchair)?: A Little Help needed walking in hospital room?: A Little Help needed climbing 3-5 steps with a railing? : A  Little 6 Click Score: 18    End of Session Equipment Utilized During Treatment: Gait belt;Oxygen Activity Tolerance: Patient tolerated treatment well Patient left: in chair;with call bell/phone within reach;with chair alarm set;with family/visitor present   PT Visit Diagnosis: Unsteadiness on feet (R26.81);Muscle weakness (generalized) (M62.81);History of falling (Z91.81)    Time: 1610-9604 PT Time Calculation (min) (ACUTE ONLY): 30 min   Charges:   PT Evaluation $PT Eval Low Complexity: 1 Low PT Treatments $Therapeutic Activity: 8-22 mins   PT G Codes:   PT G-Codes **NOT FOR INPATIENT CLASS** Functional Assessment Tool Used: AM-PAC 6 Clicks Basic Mobility    Glenetta Hew, PT, DPT  Glenetta Hew 06/10/2017, 11:14 AM

## 2017-06-10 NOTE — Progress Notes (Signed)
[facility] - [place] [facility]   PATIENT NAME: Matthew Simmons    MR#:  [id number]  DATE OF BIRTH:  06-11-1934  SUBJECTIVE:  CHIEF COMPLAINT:   Chief Complaint  Patient presents with  . Shortness of Breath   Continues to have shortness of breath with minimal exertion and have hypotension, but asymptomatic with that.  REVIEW OF SYSTEMS:    Review of Systems  Constitutional: Positive for malaise/fatigue. Negative for chills and fever.  HENT: Negative for sore throat.   Eyes: Negative for blurred vision, double vision and pain.  Respiratory: Positive for cough, shortness of breath and wheezing. Negative for hemoptysis and sputum production.   Cardiovascular: Negative for chest pain, palpitations, orthopnea and leg swelling.  Gastrointestinal: Negative for abdominal pain, constipation, diarrhea, heartburn, nausea and vomiting.  Genitourinary: Negative for dysuria and hematuria.  Musculoskeletal: Negative for back pain and joint pain.  Skin: Negative for rash.  Neurological: Negative for sensory change, speech change, focal weakness and headaches.  Endo/Heme/Allergies: Does not bruise/bleed easily.  Psychiatric/Behavioral: Negative for depression. The patient is not nervous/anxious.     DRUG ALLERGIES:  No Known Allergies  VITALS:  Blood pressure (!) 80/61, pulse 70, temperature (!) 97.5 F (36.4 C), temperature source Oral, resp. rate 20, height 6' (1.829 m), weight 88.6 kg (195 lb 6.4 oz), SpO2 92 %.  PHYSICAL EXAMINATION:   Physical Exam  GENERAL:  83 y.o.-year-old patient lying in the bed with conversational dyspnea EYES: Pupils equal, round, reactive to light and accommodation. No scleral icterus. Extraocular muscles intact.  HEENT: Head atraumatic, normocephalic. Oropharynx and nasopharynx clear.  NECK:  Supple, no jugular venous distention. No thyroid enlargement, no tenderness.  LUNGS: Bilateral wheezing and decreased air entry CARDIOVASCULAR:  S1, S2 normal. No murmurs, rubs, or gallops.  ABDOMEN: Soft, nontender, nondistended. Bowel sounds present. No organomegaly or mass.  EXTREMITIES: No cyanosis, clubbing or edema b/l.    NEUROLOGIC: Cranial nerves II through XII are intact. No focal Motor or sensory deficits b/l.   PSYCHIATRIC: The patient is alert and oriented x 3.  SKIN: No obvious rash, lesion, or ulcer.   LABORATORY PANEL:   CBC Recent Labs  Lab 06/07/17 2141  WBC 12.5*  HGB 13.3  HCT 41.4  PLT 252   ------------------------------------------------------------------------------------------------------------------ Chemistries  Recent Labs  Lab 06/07/17 2141 06/08/17 0411 06/10/17 0620  NA 140  --  139  K 4.2  --  4.2  CL 102  --  101  CO2 28  --  33*  GLUCOSE 136*  --  119*  BUN 21*  --  28*  CREATININE 1.34*  --  1.41*  CALCIUM 9.0  --  8.7*  MG  --  1.7  --   AST 28  --   --   ALT 17  --   --   ALKPHOS 55  --   --   BILITOT 1.0  --   --    ------------------------------------------------------------------------------------------------------------------  Cardiac Enzymes Recent Labs  Lab 06/07/17 2141  TROPONINI 0.03*   ------------------------------------------------------------------------------------------------------------------  RADIOLOGY:  No results found.   ASSESSMENT AND PLAN:   This is an 83 year old male admitted for acute kidney injury.  *Acute COPD exacerbation. Prednisone, Nebs. Inhalers Patient wants to follow-up with pulmonary as outpatient  *Acute metabolic encephalopathy has resolved.  *Chronic systolic and diastolic CHF is stable.  Continue home medications.   Diuretics on hold due to Hypotension and renal failure.  *  Atrial fibrillation: Rate controlled; continue warfarin.  *  BPH: Continue Flomax as well as finasteride  * Ac renal failure   Due to hypotension and dehydration.    Hold Diuretics and gentle hydration.  * Hypotension   Hold coreg and  Diuretics , Monitor, Gentle hydration.  Pt have severe systolic + Diastolic CHF, which seems terminal, will call palliative care to discuss prognosis and options.  All the records are reviewed and case discussed with Care Management/Social Worker Management plans discussed with the patient, family and they are in agreement.  CODE STATUS: DNR  DVT Prophylaxis: SCDs  TOTAL TIME TAKING CARE OF THIS PATIENT: 30 minutes.   POSSIBLE D/C IN 1-2 DAYS, DEPENDING ON CLINICAL CONDITION.  Altamese DillingVaibhavkumar Mechille Varghese M.D on 06/10/2017 at 7:23 PM  Between 7am to 6pm - Pager - [phone]  After 6pm go to [email or web address] - password [email or web address]  [facility]  Office  [phone]  CC: Primary care physician; System, Pcp Not In  Note: This dictation was prepared with Dragon dictation along with smaller phrase technology. Any transcriptional errors that result from this process are unintentional.

## 2017-06-11 DIAGNOSIS — Z7189 Other specified counseling: Secondary | ICD-10-CM

## 2017-06-11 DIAGNOSIS — R4182 Altered mental status, unspecified: Secondary | ICD-10-CM

## 2017-06-11 DIAGNOSIS — Z515 Encounter for palliative care: Secondary | ICD-10-CM

## 2017-06-11 DIAGNOSIS — R531 Weakness: Secondary | ICD-10-CM | POA: Diagnosis not present

## 2017-06-11 DIAGNOSIS — N179 Acute kidney failure, unspecified: Secondary | ICD-10-CM | POA: Diagnosis not present

## 2017-06-11 LAB — BASIC METABOLIC PANEL
ANION GAP: 6 (ref 5–15)
BUN: 32 mg/dL — ABNORMAL HIGH (ref 6–20)
CALCIUM: 8.5 mg/dL — AB (ref 8.9–10.3)
CO2: 31 mmol/L (ref 22–32)
Chloride: 102 mmol/L (ref 101–111)
Creatinine, Ser: 1.12 mg/dL (ref 0.61–1.24)
Glucose, Bld: 108 mg/dL — ABNORMAL HIGH (ref 65–99)
POTASSIUM: 3.6 mmol/L (ref 3.5–5.1)
SODIUM: 139 mmol/L (ref 135–145)

## 2017-06-11 MED ORDER — TORSEMIDE 10 MG PO TABS: 10.0000 mg | ORAL_TABLET | Freq: Every day | ORAL | 0 refills | Status: AC | PRN

## 2017-06-11 MED ORDER — GUAIFENESIN 100 MG/5ML PO SOLN: 10.0000 mL | ORAL | 0 refills | Status: AC | PRN

## 2017-06-11 MED ORDER — PREDNISONE 10 MG (21) PO TBPK: ORAL_TABLET | ORAL | 0 refills | Status: AC

## 2017-06-11 NOTE — Care Management Important Message (Signed)
Important Message  Patient Details  Name: Matthew Simmons MRN: [id number] Date of Birth: [birth year]   Medicare Important Message Given:  Yes    Olegario MessierKathy A Justis Closser 06/11/2017, 10:37 AM
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
## 2017-06-11 NOTE — Care Management (Signed)
[facility] therapy evaluation completed, Recommending home with home health and therapy. Spoke to the wife. States they have [facility] services in the home. States "we will figure-it out." Gwenette GreetBrenda S Janijah Symons RN MSN CCM care Management [phone]

## 2017-06-11 NOTE — Discharge Instructions (Signed)
Follow with PMD in 1-2 weeks. Follow with cardiologist in 1 week, as we had stopped Coreg and Changed torsemide to as needed, due to your Blood pressure running low and having dehydration.   Information on my medicine - Coumadin   (Warfarin)  Why was Coumadin prescribed for you? Coumadin was prescribed for you because you have a blood clot or a medical condition that can cause an increased risk of forming blood clots. Blood clots can cause serious health problems by blocking the flow of blood to the heart, lung, or brain. Coumadin can prevent harmful blood clots from forming. As a reminder your indication for Coumadin is:   Stroke Prevention Because Of Atrial Fibrillation  What test will check on my response to Coumadin? While on Coumadin (warfarin) you will need to have an INR test regularly to ensure that your dose is keeping you in the desired range. The INR (international normalized ratio) number is calculated from the result of the laboratory test called prothrombin time (PT).  If an INR APPOINTMENT HAS NOT ALREADY BEEN MADE FOR YOU please schedule an appointment to have this lab work done by your health care provider within 7 days. Your INR goal is usually a number between:  2 to 3 or your provider may give you a more narrow range like 2-2.5.  Ask your health care provider during an office visit what your goal INR is.  What  do you need to  know  About  COUMADIN? Take Coumadin (warfarin) exactly as prescribed by your healthcare provider about the same time each day.  DO NOT stop taking without talking to the doctor who prescribed the medication.  Stopping without other blood clot prevention medication to take the place of Coumadin may increase your risk of developing a new clot or stroke.  Get refills before you run out.  What do you do if you miss a dose? If you miss a dose, take it as soon as you remember on the same day then continue your regularly scheduled regimen the next day.  Do  not take two doses of Coumadin at the same time.  Important Safety Information A possible side effect of Coumadin (Warfarin) is an increased risk of bleeding. You should call your healthcare provider right away if you experience any of the following: ? Bleeding from an injury or your nose that does not stop. ? Unusual colored urine (red or dark brown) or unusual colored stools (red or black). ? Unusual bruising for unknown reasons. ? A serious fall or if you hit your head (even if there is no bleeding).  Some foods or medicines interact with Coumadin (warfarin) and might alter your response to warfarin. To help avoid this: ? Eat a balanced diet, maintaining a consistent amount of Vitamin K. ? Notify your provider about major diet changes you plan to make. ? Avoid alcohol or limit your intake to 1 drink for women and 2 drinks for men per day. (1 drink is 5 oz. wine, 12 oz. beer, or 1.5 oz. liquor.)  Make sure that ANY health care provider who prescribes medication for you knows that you are taking Coumadin (warfarin).  Also make sure the healthcare provider who is monitoring your Coumadin knows when you have started a new medication including herbals and non-prescription products.  Coumadin (Warfarin)  Major Drug Interactions  Increased Warfarin Effect Decreased Warfarin Effect  Alcohol (large quantities) Antibiotics (esp. Septra/Bactrim, Flagyl, Cipro) Amiodarone (Cordarone) Aspirin (ASA) Cimetidine (Tagamet) Megestrol (Megace) NSAIDs (ibuprofen,  naproxen, etc.) °Piroxicam (Feldene) °Propafenone (Rythmol SR) °Propranolol (Inderal) °Isoniazid (INH) °Posaconazole (Noxafil) Barbiturates (Phenobarbital) °Carbamazepine (Tegretol) °Chlordiazepoxide (Librium) °Cholestyramine (Questran) °Griseofulvin °Oral Contraceptives °Rifampin °Sucralfate (Carafate) °Vitamin K  ° °Coumadin® (Warfarin) Major Herbal Interactions  °Increased Warfarin Effect Decreased Warfarin Effect  °Garlic °Ginseng °Ginkgo  biloba Coenzyme Q10 °Green tea °St. John’s wort   ° °Coumadin® (Warfarin) FOOD Interactions  °Eat a consistent number of servings per week of foods HIGH in Vitamin K °(1 serving = ½ cup)  °Collards (cooked, or boiled & drained) °Kale (cooked, or boiled & drained) °Mustard greens (cooked, or boiled & drained) °Parsley *serving size only = ¼ cup °Spinach (cooked, or boiled & drained) °Swiss chard (cooked, or boiled & drained) °Turnip greens (cooked, or boiled & drained)  °Eat a consistent number of servings per week of foods MEDIUM-HIGH in Vitamin K °(1 serving = 1 cup)  °Asparagus (cooked, or boiled & drained) °Broccoli (cooked, boiled & drained, or raw & chopped) °Brussel sprouts (cooked, or boiled & drained) *serving size only = ½ cup °Lettuce, raw (green leaf, endive, romaine) °Spinach, raw °Turnip greens, raw & chopped  ° °These websites have more information on Coumadin (warfarin):  www.coumadin.com; °www.ahrq.gov/consumer/coumadin.htm; ° ° ° °
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
## 2017-06-11 NOTE — Progress Notes (Signed)
Patient discharged home per MD order. All discharge instructions given to patient and wife and all questions answered. Prescriptions given to patient and wife.

## 2017-06-19 ENCOUNTER — Inpatient Hospital Stay: Payer: Medicare HMO | Admitting: Internal Medicine

## 2017-06-23 DIAGNOSIS — Z7189 Other specified counseling: Secondary | ICD-10-CM

## 2017-06-23 DIAGNOSIS — R4182 Altered mental status, unspecified: Secondary | ICD-10-CM

## 2017-06-23 DIAGNOSIS — Z515 Encounter for palliative care: Secondary | ICD-10-CM

## 2017-06-23 DIAGNOSIS — R531 Weakness: Secondary | ICD-10-CM

## 2017-09-17 DEATH — deceased

## 2019-08-10 IMAGING — CT CT HEAD W/O CM
3 of 4 series · 15 of 47 positions shown, 18 images · non-contrast
Comparison: None.

CLINICAL DATA: Altered level of consciousness

EXAM:
CT HEAD WITHOUT CONTRAST
TECHNIQUE: Contiguous axial images were obtained from the base of the skull
through the vertex without intravenous contrast.

[Series 2: head wo · axial · 0.47mm/px · z∈[-161,-36]mm · 9 of 31 slices shown, 12 images]
[im 3/31  brain]
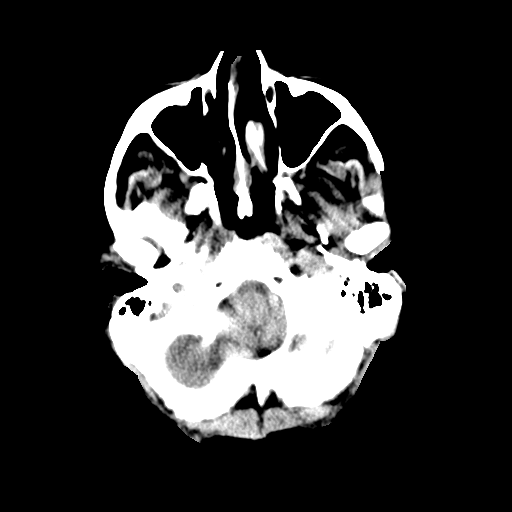
[im 3/31  bone]
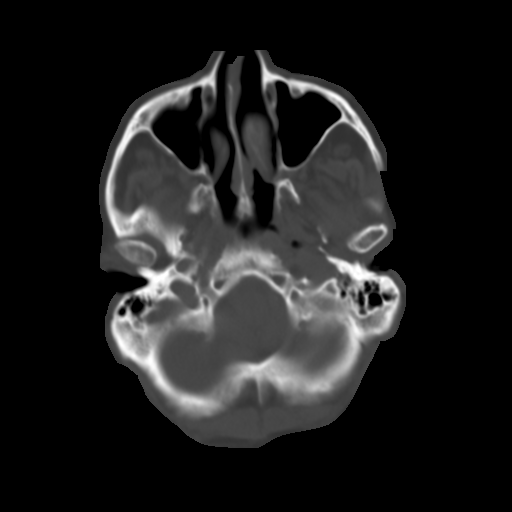
[im 7/31  brain]
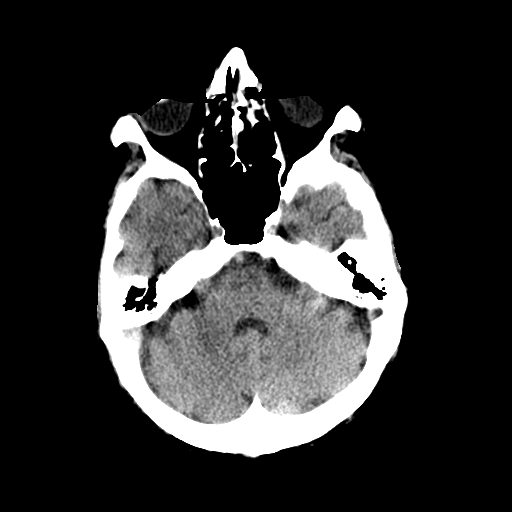
[im 9/31  brain]
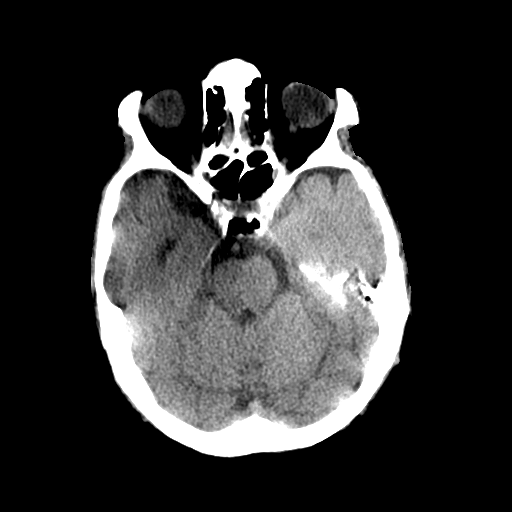
[im 13/31  brain]
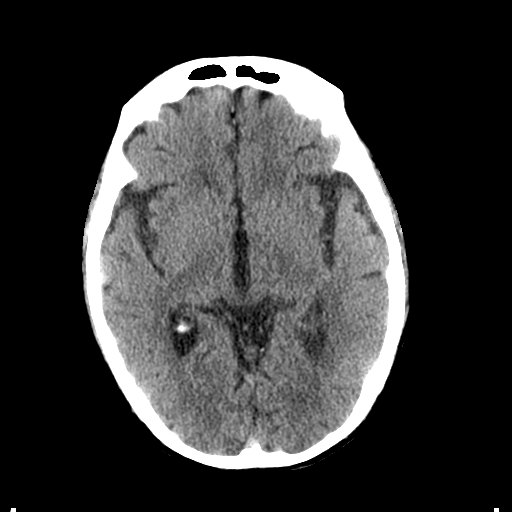
[im 16/31  brain]
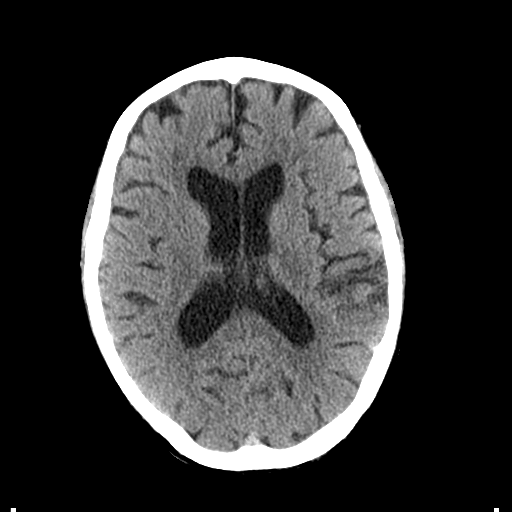
[im 16/31  bone]
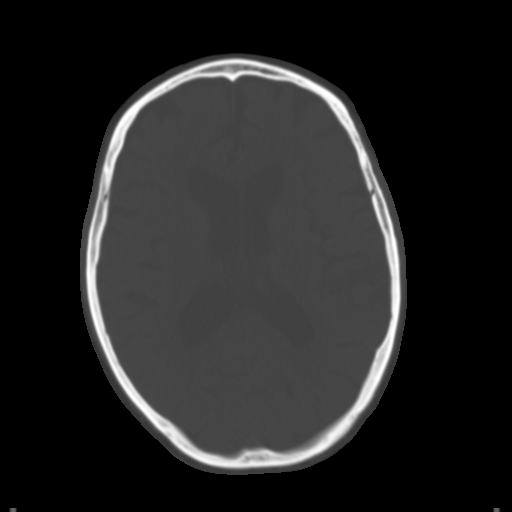
[im 19/31  brain]
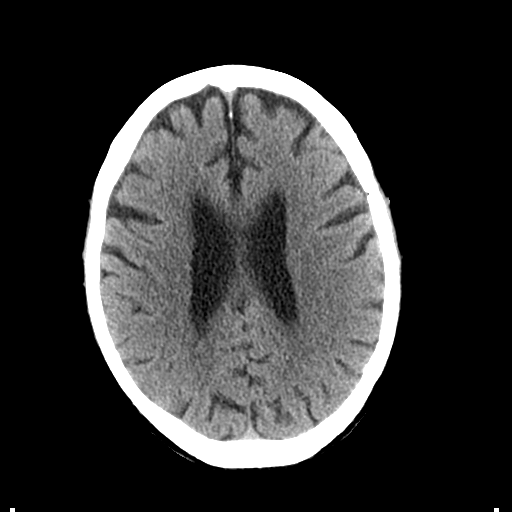
[im 22/31  brain]
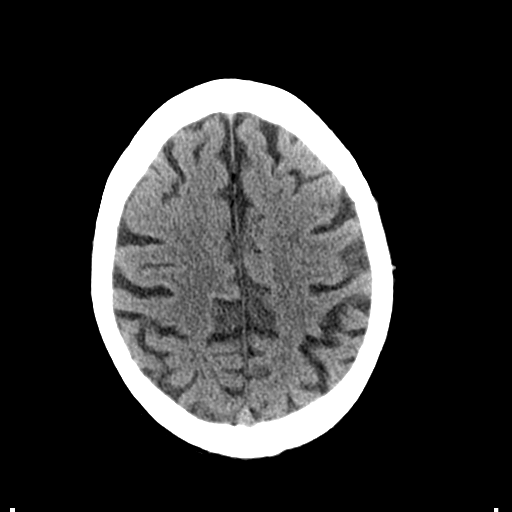
[im 26/31  brain]
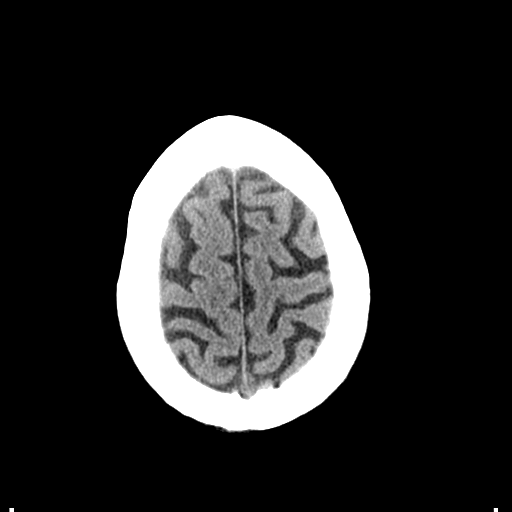
[im 28/31  brain]
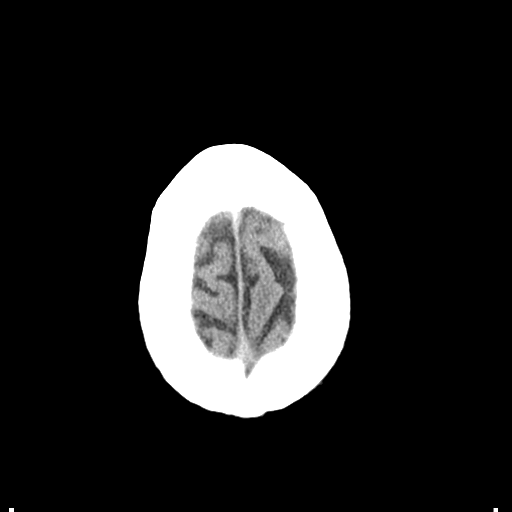
[im 28/31  bone]
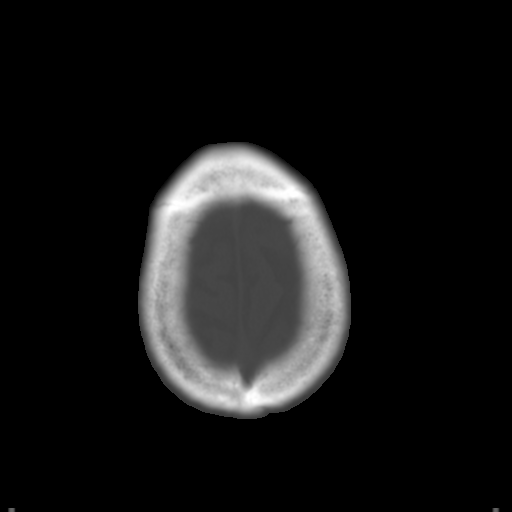

[Series 4: coronal soft tissue · coronal · 0.29mm/px · 3 of 72 slices shown]
[im 24/72  brain]
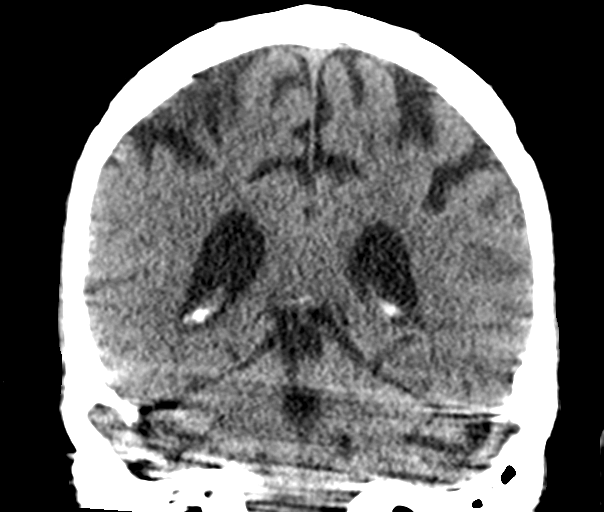
[im 32/72  brain]
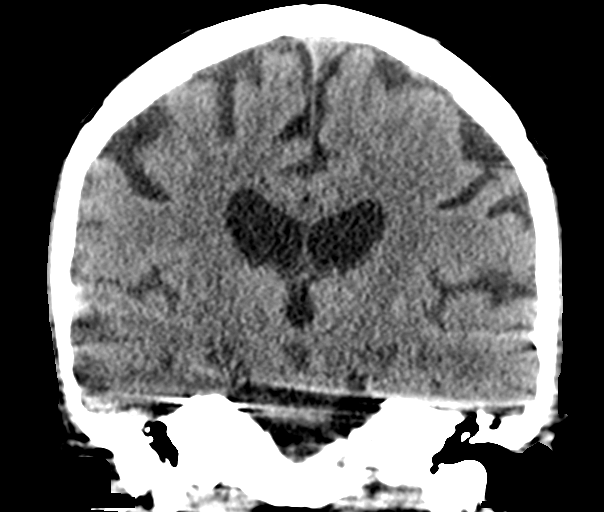
[im 40/72  brain]
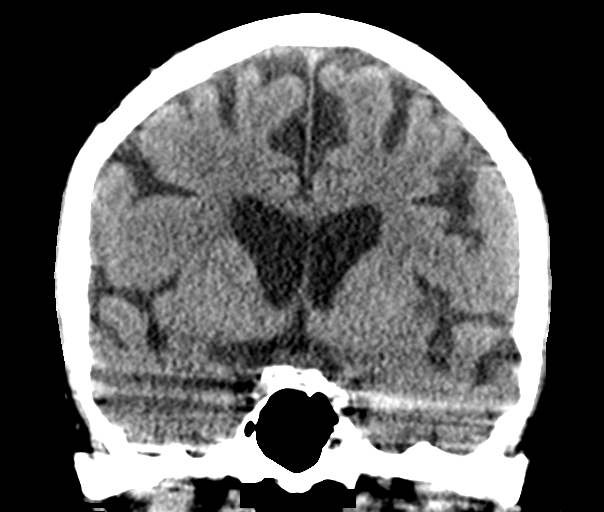

[Series 5: sagittal soft tissue · sagittal · 0.29mm/px · 3 of 60 slices shown]
[im 20/60  brain]
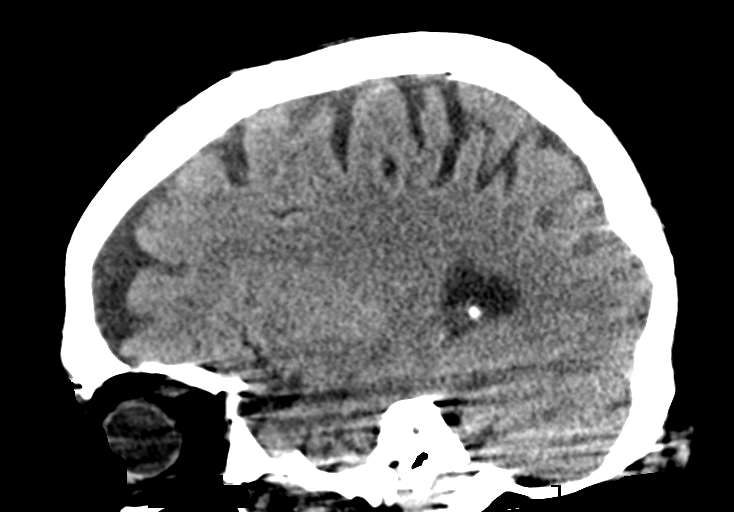
[im 30/60  brain]
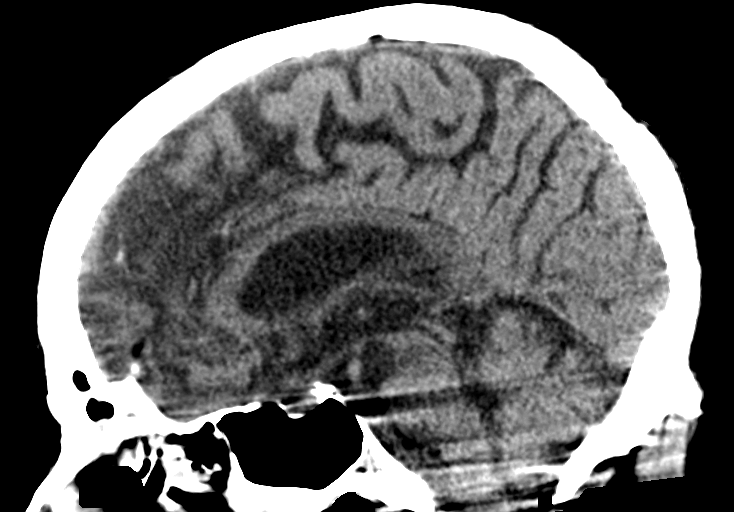
[im 40/60  brain]
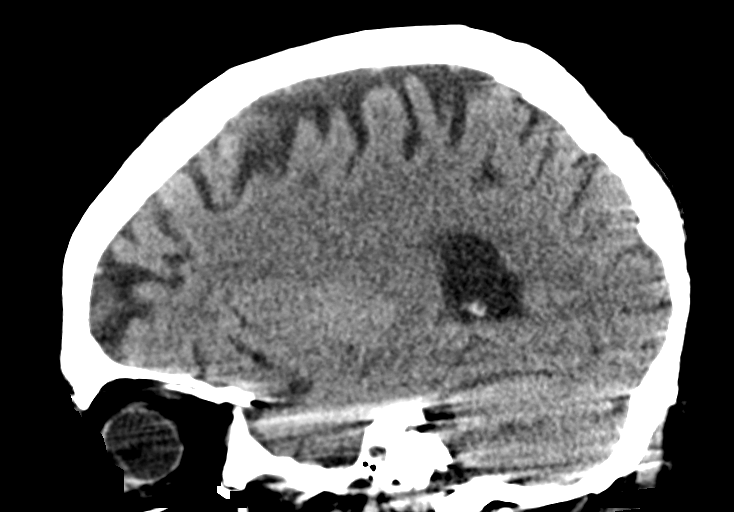

[15 of 47 positions shown; findings below may reference images not displayed]

FINDINGS: Brain: Mild age related volume loss. No acute intracranial
abnormality. Specifically, no hemorrhage, hydrocephalus, mass
lesion, acute infarction, or significant intracranial injury.

Vascular: No hyperdense vessel or unexpected calcification.

Skull: No acute calvarial abnormality.

Sinuses/Orbits: Mucosal thickening in the ethmoid air cells.

Other: None
IMPRESSION: No acute intracranial abnormality.
